# Patient Record
Sex: Male | Born: 1990
Health system: Southern US, Community
[De-identification: ages and names within clinical notes are randomized; demographics above are authoritative.]

---
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
## 2013-05-10 ENCOUNTER — Emergency Department (HOSPITAL_COMMUNITY)
Admission: EM | Admit: 2013-05-10 | Discharge: 2013-05-10 | Disposition: A | Discharge status: Home / Self Care | Payer: PRIVATE HEALTH INSURANCE | Attending: Emergency Medicine | Admitting: Emergency Medicine

## 2013-05-10 ENCOUNTER — Emergency Department (HOSPITAL_COMMUNITY): Payer: PRIVATE HEALTH INSURANCE

## 2013-05-10 ENCOUNTER — Emergency Department (HOSPITAL_COMMUNITY): Admission: EM | Admit: 2013-05-10 | Discharge: 2013-05-10 | Discharge status: Against Medical Advice | Payer: Self-pay

## 2013-05-10 ENCOUNTER — Encounter (HOSPITAL_COMMUNITY): Payer: Self-pay | Admitting: Emergency Medicine

## 2013-05-10 DIAGNOSIS — S20229A Contusion of unspecified back wall of thorax, initial encounter: Secondary | ICD-10-CM | POA: Insufficient documentation

## 2013-05-10 DIAGNOSIS — Y9289 Other specified places as the place of occurrence of the external cause: Secondary | ICD-10-CM | POA: Insufficient documentation

## 2013-05-10 DIAGNOSIS — Y9352 Activity, horseback riding: Secondary | ICD-10-CM | POA: Insufficient documentation

## 2013-05-10 DIAGNOSIS — W19XXXA Unspecified fall, initial encounter: Secondary | ICD-10-CM

## 2013-05-10 DIAGNOSIS — R296 Repeated falls: Secondary | ICD-10-CM | POA: Insufficient documentation

## 2013-05-10 DIAGNOSIS — S300XXA Contusion of lower back and pelvis, initial encounter: Secondary | ICD-10-CM

## 2013-05-10 LAB — URINALYSIS, ROUTINE W REFLEX MICROSCOPIC
Bilirubin Urine: NEGATIVE
Glucose, UA: NEGATIVE mg/dL
Hgb urine dipstick: NEGATIVE
Ketones, ur: NEGATIVE mg/dL
Leukocytes, UA: NEGATIVE
Nitrite: NEGATIVE
Protein, ur: NEGATIVE mg/dL
Specific Gravity, Urine: 1.029 (ref 1.005–1.030)
Urobilinogen, UA: 1 mg/dL (ref 0.0–1.0)
pH: 5.5 (ref 5.0–8.0)

## 2013-05-10 MED ORDER — IBUPROFEN 800 MG PO TABS: 800.0000 mg | ORAL_TABLET | Freq: Three times a day (TID) | ORAL | Status: DC

## 2013-05-10 MED ORDER — HYDROCODONE-ACETAMINOPHEN 5-325 MG PO TABS: 1.0000 | ORAL_TABLET | ORAL | Status: DC | PRN

## 2013-05-10 NOTE — ED Notes (Signed)
He states he was bucked from a horse about two hours p.t.a.  He states his low back area struck a rock. He has an abrasion at right lumbar area.  He is in no distress.  He ambulates slowly and capably.

## 2013-05-10 NOTE — ED Provider Notes (Signed)
History/physical exam/procedure(s) were performed by non-physician practitioner and as supervising physician I was immediately available for consultation/collaboration. I have reviewed all notes and am in agreement with care and plan.   Hilario Quarryanielle S Lieutenant Abarca, MD 05/10/13 [phone]

## 2013-05-10 NOTE — ED Provider Notes (Signed)
CSN: [id number]     Arrival date & time 05/10/13  1811 History   First MD Initiated Contact with Patient 05/10/13 1908     Chief Complaint  Patient presents with  . Fall  . Back Pain     (Consider location/radiation/quality/duration/timing/severity/associated sxs/prior Treatment) Patient is a 23 y.o. male presenting with fall and back pain. The history is provided by the patient. No language interpreter was used.  Fall This is a new problem. The current episode started today. Pertinent negatives include no chills or fever. Associated symptoms comments: He was thrown while horseback riding and reports landing flat on his back to the ground on a rock that injured in lower back. He has been ambulatory since the fall. He has not been incontinent. He denies hitting his head or sustaining other injury..  Back Pain Associated symptoms: no fever     No past medical history on file. No past surgical history on file. No family history on file. History  Substance Use Topics  . Smoking status: Never Smoker   . Smokeless tobacco: Not on file  . Alcohol Use: Not on file    Review of Systems  Constitutional: Negative for fever and chills.  Respiratory: Negative.   Cardiovascular: Negative.   Gastrointestinal: Negative.   Genitourinary: Negative.  Negative for difficulty urinating.  Musculoskeletal: Positive for back pain.       See HPI  Skin: Negative.  Negative for wound.  Neurological: Negative.       Allergies  Review of patient's allergies indicates no known allergies.  Home Medications   Current Outpatient Rx  Name  Route  Sig  Dispense  Refill  . ibuprofen (ADVIL,MOTRIN) 200 MG tablet   Oral   Take 400 mg by mouth every 6 (six) hours as needed for moderate pain.         Marland Kitchen. PRESCRIPTION MEDICATION   Oral   Take 1 tablet by mouth once. Anti-inflammatory, possibly meloxicam         . PRESCRIPTION MEDICATION   Oral   Take 1 tablet by mouth once. For muscle spasms           BP 151/78  Pulse 90  Temp(Src) 98.8 F (37.1 C) (Oral)  Resp 18  SpO2 100% Physical Exam  Constitutional: He is oriented to person, place, and time. He appears well-developed and well-nourished.  Neck: Normal range of motion.  Pulmonary/Chest: Effort normal.  Abdominal: There is no tenderness.  Musculoskeletal: Normal range of motion.  Midline tenderness and swelling over lumbar spine.   Neurological: He is alert and oriented to person, place, and time.  Skin: Skin is warm and dry.  Linear abrasion across right lower back from midline lumbar to right flank. No bleeding or laceration.  Psychiatric: He has a normal mood and affect.    ED Course  Procedures (including critical care time) Labs Review Labs Reviewed  URINALYSIS, ROUTINE W REFLEX MICROSCOPIC   Results for orders placed during the hospital encounter of 05/10/13  URINALYSIS, ROUTINE W REFLEX MICROSCOPIC      Result Value Ref Range   Color, Urine YELLOW  YELLOW   APPearance CLEAR  CLEAR   Specific Gravity, Urine 1.029  1.005 - 1.030   pH 5.5  5.0 - 8.0   Glucose, UA NEGATIVE  NEGATIVE mg/dL   Hgb urine dipstick NEGATIVE  NEGATIVE   Bilirubin Urine NEGATIVE  NEGATIVE   Ketones, ur NEGATIVE  NEGATIVE mg/dL   Protein, ur NEGATIVE  NEGATIVE mg/dL  Urobilinogen, UA 1.0  0.0 - 1.0 mg/dL   Nitrite NEGATIVE  NEGATIVE   Leukocytes, UA NEGATIVE  NEGATIVE   No results found.  Imaging Review No results found.   EKG Interpretation None      MDM   Final diagnoses:  None    1. Fall 2. Contusion back  Discussed lumbar x-ray results with Dr. Mosetta Putt (radiology) who reports lumbar film as negative, but is not accessible for viewing from [facility]. UA negative for hematuria. He declines pain medication in the ED. VSS. Ambulatory without difficulty. Stable for discharge.     Arnoldo Hooker, PA-C 05/10/13 2127

## 2013-05-10 NOTE — Discharge Instructions (Signed)
Contusion °A contusion is a deep bruise. Contusions are the result of an injury that caused bleeding under the skin. The contusion may turn blue, purple, or yellow. Minor injuries will give you a painless contusion, but more severe contusions may stay painful and swollen for a few weeks.  °CAUSES  °A contusion is usually caused by a blow, trauma, or direct force to an area of the body. °SYMPTOMS  °· Swelling and redness of the injured area. °· Bruising of the injured area. °· Tenderness and soreness of the injured area. °· Pain. °DIAGNOSIS  °The diagnosis can be made by taking a history and physical exam. An X-ray, CT scan, or MRI may be needed to determine if there were any associated injuries, such as fractures. °TREATMENT  °Specific treatment will depend on what area of the body was injured. In general, the best treatment for a contusion is resting, icing, elevating, and applying cold compresses to the injured area. Over-the-counter medicines may also be recommended for pain control. Ask your caregiver what the best treatment is for your contusion. °HOME CARE INSTRUCTIONS  °· Put ice on the injured area. °· Put ice in a plastic bag. °· Place a towel between your skin and the bag. °· Leave the ice on for 15-20 minutes, 03-04 times a day. °· Only take over-the-counter or prescription medicines for pain, discomfort, or fever as directed by your caregiver. Your caregiver may recommend avoiding anti-inflammatory medicines (aspirin, ibuprofen, and naproxen) for 48 hours because these medicines may increase bruising. °· Rest the injured area. °· If possible, elevate the injured area to reduce swelling. °SEEK IMMEDIATE MEDICAL CARE IF:  °· You have increased bruising or swelling. °· You have pain that is getting worse. °· Your swelling or pain is not relieved with medicines. °MAKE SURE YOU:  °· Understand these instructions. °· Will watch your condition. °· Will get help right away if you are not doing well or get  worse. °Document Released: 11/15/2004 Document Revised: 04/30/2011 Document Reviewed: 12/11/2010 °ExitCare® Patient Information ©2014 [employer]. ° °Cryotherapy °Cryotherapy means treatment with cold. Ice or gel packs can be used to reduce both pain and swelling. Ice is the most helpful within the first 24 to 48 hours after an injury or flareup from overusing a muscle or joint. Sprains, strains, spasms, burning pain, shooting pain, and aches can all be eased with ice. Ice can also be used when recovering from surgery. Ice is effective, has very few side effects, and is safe for most people to use. °PRECAUTIONS  °Ice is not a safe treatment option for people with: °· Raynaud's phenomenon. This is a condition affecting small blood vessels in the extremities. Exposure to cold may cause your problems to return. °· Cold hypersensitivity. There are many forms of cold hypersensitivity, including: °· Cold urticaria. Red, itchy hives appear on the skin when the tissues begin to warm after being iced. °· Cold erythema. This is a red, itchy rash caused by exposure to cold. °· Cold hemoglobinuria. Red blood cells break down when the tissues begin to warm after being iced. The hemoglobin that carry oxygen are passed into the urine because they cannot combine with blood proteins fast enough. °· Numbness or altered sensitivity in the area being iced. °If you have any of the following conditions, do not use ice until you have discussed cryotherapy with your caregiver: °· Heart conditions, such as arrhythmia, angina, or chronic heart disease. °· High blood pressure. °· Healing wounds or open skin   in the area being iced. °· Current infections. °· Rheumatoid arthritis. °· Poor circulation. °· Diabetes. °Ice slows the blood flow in the region it is applied. This is beneficial when trying to stop inflamed tissues from spreading irritating chemicals to surrounding tissues. However, if you expose your skin to cold temperatures for too  long or without the proper protection, you can damage your skin or nerves. Watch for signs of skin damage due to cold. °HOME CARE INSTRUCTIONS °Follow these tips to use ice and cold packs safely. °· Place a dry or damp towel between the ice and skin. A damp towel will cool the skin more quickly, so you may need to shorten the time that the ice is used. °· For a more rapid response, add gentle compression to the ice. °· Ice for no more than 10 to 20 minutes at a time. The bonier the area you are icing, the less time it will take to get the benefits of ice. °· Check your skin after 5 minutes to make sure there are no signs of a poor response to cold or skin damage. °· Rest 20 minutes or more in between uses. °· Once your skin is numb, you can end your treatment. You can test numbness by very lightly touching your skin. The touch should be so light that you do not see the skin dimple from the pressure of your fingertip. When using ice, most people will feel these normal sensations in this order: cold, burning, aching, and numbness. °· Do not use ice on someone who cannot communicate their responses to pain, such as small children or people with dementia. °HOW TO MAKE AN ICE PACK °Ice packs are the most common way to use ice therapy. Other methods include ice massage, ice baths, and cryo-sprays. Muscle creams that cause a cold, tingly feeling do not offer the same benefits that ice offers and should not be used as a substitute unless recommended by your caregiver. °To make an ice pack, do one of the following: °· Place crushed ice or a bag of frozen vegetables in a sealable plastic bag. Squeeze out the excess air. Place this bag inside another plastic bag. Slide the bag into a pillowcase or place a damp towel between your skin and the bag. °· Mix 3 parts water with 1 part rubbing alcohol. Freeze the mixture in a sealable plastic bag. When you remove the mixture from the freezer, it will be slushy. Squeeze out the excess  air. Place this bag inside another plastic bag. Slide the bag into a pillowcase or place a damp towel between your skin and the bag. °SEEK MEDICAL CARE IF: °· You develop white spots on your skin. This may give the skin a blotchy (mottled) appearance. °· Your skin turns blue or pale. °· Your skin becomes waxy or hard. °· Your swelling gets worse. °MAKE SURE YOU:  °· Understand these instructions. °· Will watch your condition. °· Will get help right away if you are not doing well or get worse. °Document Released: 10/02/2010 Document Revised: 04/30/2011 Document Reviewed: 10/02/2010 °ExitCare® Patient Information ©2014 [employer]. ° °

## 2013-05-10 NOTE — ED Notes (Signed)
Bed: WA24 Expected date:  Expected time:  Means of arrival:  Comments: triage 

## 2016-02-15 ENCOUNTER — Emergency Department (HOSPITAL_COMMUNITY): Payer: No Typology Code available for payment source | Admitting: Certified Registered"

## 2016-02-15 ENCOUNTER — Encounter (HOSPITAL_COMMUNITY): Payer: Self-pay | Admitting: Emergency Medicine

## 2016-02-15 ENCOUNTER — Ambulatory Visit (HOSPITAL_COMMUNITY)
Admission: EM | Admit: 2016-02-15 | Discharge: 2016-02-16 | Disposition: A | Discharge status: Home / Self Care | Payer: No Typology Code available for payment source | Attending: General Surgery | Admitting: General Surgery

## 2016-02-15 ENCOUNTER — Emergency Department (HOSPITAL_COMMUNITY): Payer: No Typology Code available for payment source

## 2016-02-15 ENCOUNTER — Encounter (HOSPITAL_COMMUNITY)
Admission: EM | Disposition: A | Discharge status: Home / Self Care | Payer: Self-pay | Source: Home / Self Care | Attending: Emergency Medicine

## 2016-02-15 DIAGNOSIS — K353 Acute appendicitis with localized peritonitis, without perforation or gangrene: Secondary | ICD-10-CM

## 2016-02-15 DIAGNOSIS — K3580 Unspecified acute appendicitis: Secondary | ICD-10-CM | POA: Diagnosis present

## 2016-02-15 HISTORY — PX: LAPAROSCOPIC APPENDECTOMY: SHX408

## 2016-02-15 LAB — URINALYSIS, ROUTINE W REFLEX MICROSCOPIC
Bilirubin Urine: NEGATIVE
Glucose, UA: NEGATIVE mg/dL
Hgb urine dipstick: NEGATIVE
Ketones, ur: NEGATIVE mg/dL
Nitrite: NEGATIVE
Protein, ur: NEGATIVE mg/dL
Specific Gravity, Urine: 1.012 (ref 1.005–1.030)
pH: 6 (ref 5.0–8.0)

## 2016-02-15 LAB — COMPREHENSIVE METABOLIC PANEL
ALT: 88 U/L — ABNORMAL HIGH (ref 17–63)
AST: 80 U/L — ABNORMAL HIGH (ref 15–41)
Albumin: 4.3 g/dL (ref 3.5–5.0)
Alkaline Phosphatase: 91 U/L (ref 38–126)
Anion gap: 10 (ref 5–15)
BUN: 9 mg/dL (ref 6–20)
CO2: 27 mmol/L (ref 22–32)
Calcium: 9.9 mg/dL (ref 8.9–10.3)
Chloride: 103 mmol/L (ref 101–111)
Creatinine, Ser: 1.11 mg/dL (ref 0.61–1.24)
GFR calc Af Amer: >60 mL/min (ref >60)
GFR calc non Af Amer: >60 mL/min (ref >60)
Glucose, Bld: 128 mg/dL — ABNORMAL HIGH (ref 65–99)
Potassium: 3.9 mmol/L (ref 3.5–5.1)
Sodium: 140 mmol/L (ref 135–145)
Total Bilirubin: 0.7 mg/dL (ref 0.3–1.2)
Total Protein: 7.8 g/dL (ref 6.5–8.1)

## 2016-02-15 LAB — CBC
HCT: 43.3 % (ref 39.0–52.0)
Hemoglobin: 14.4 g/dL (ref 13.0–17.0)
MCH: 28.2 pg (ref 26.0–34.0)
MCHC: 33.3 g/dL (ref 30.0–36.0)
MCV: 84.9 fL (ref 78.0–100.0)
Platelets: 304 10*3/uL (ref 150–400)
RBC: 5.1 MIL/uL (ref 4.22–5.81)
RDW: 13.3 % (ref 11.5–15.5)
WBC: 12.9 10*3/uL — ABNORMAL HIGH (ref 4.0–10.5)

## 2016-02-15 LAB — LIPASE, BLOOD: Lipase: 27 U/L (ref 11–51)

## 2016-02-15 SURGERY — APPENDECTOMY, LAPAROSCOPIC
Anesthesia: General | Site: Abdomen

## 2016-02-15 MED ORDER — SUFENTANIL CITRATE 50 MCG/ML IV SOLN
INTRAVENOUS | Status: AC
Start: 2016-02-15 — End: 2016-02-15
  Filled 2016-02-15: qty 1

## 2016-02-15 MED ORDER — SODIUM CHLORIDE 0.9 % IR SOLN
Status: DC | PRN
  Administered 2016-02-15: 1000 mL

## 2016-02-15 MED ORDER — LIDOCAINE HCL (CARDIAC) 20 MG/ML IV SOLN
INTRAVENOUS | Status: DC | PRN
  Administered 2016-02-15: 100 mg via INTRATRACHEAL

## 2016-02-15 MED ORDER — BUPIVACAINE HCL (PF) 0.25 % IJ SOLN
INTRAMUSCULAR | Status: AC
  Filled 2016-02-15: qty 30

## 2016-02-15 MED ORDER — IOPAMIDOL (ISOVUE-300) INJECTION 61%
INTRAVENOUS | Status: AC
  Administered 2016-02-15: 100 mL
  Filled 2016-02-15: qty 100

## 2016-02-15 MED ORDER — SUFENTANIL CITRATE 50 MCG/ML IV SOLN
INTRAVENOUS | Status: DC | PRN
  Administered 2016-02-15: 10 ug via INTRAVENOUS
  Administered 2016-02-15: 15 ug via INTRAVENOUS

## 2016-02-15 MED ORDER — DEXTROSE 5 % IV SOLN
2.0000 g | Freq: Once | INTRAVENOUS | Status: AC
  Administered 2016-02-15: 2 g via INTRAVENOUS
  Filled 2016-02-15: qty 2

## 2016-02-15 MED ORDER — 0.9 % SODIUM CHLORIDE (POUR BTL) OPTIME
TOPICAL | Status: DC | PRN
  Administered 2016-02-15: 1000 mL

## 2016-02-15 MED ORDER — LACTATED RINGERS IV SOLN
INTRAVENOUS | Status: DC | PRN
  Administered 2016-02-15 – 2016-02-16 (×2): via INTRAVENOUS

## 2016-02-15 MED ORDER — METRONIDAZOLE IN NACL 5-0.79 MG/ML-% IV SOLN
500.0000 mg | Freq: Once | INTRAVENOUS | Status: AC
  Administered 2016-02-15: 500 mg via INTRAVENOUS
  Filled 2016-02-15: qty 100

## 2016-02-15 MED ORDER — PROPOFOL 10 MG/ML IV BOLUS
INTRAVENOUS | Status: DC | PRN
  Administered 2016-02-15: 30 mg via INTRAVENOUS
  Administered 2016-02-15: 170 mg via INTRAVENOUS

## 2016-02-15 MED ORDER — MIDAZOLAM HCL 2 MG/2ML IJ SOLN
INTRAMUSCULAR | Status: AC
  Filled 2016-02-15: qty 2

## 2016-02-15 MED ORDER — PROPOFOL 10 MG/ML IV BOLUS
INTRAVENOUS | Status: AC
  Filled 2016-02-15: qty 20

## 2016-02-15 MED ORDER — SODIUM CHLORIDE 0.9 % IV BOLUS (SEPSIS)
1000.0000 mL | Freq: Once | INTRAVENOUS | Status: AC
  Administered 2016-02-15: 1000 mL via INTRAVENOUS

## 2016-02-15 MED ORDER — DEXAMETHASONE SODIUM PHOSPHATE 10 MG/ML IJ SOLN
INTRAMUSCULAR | Status: DC | PRN
  Administered 2016-02-15: 10 mg via INTRAVENOUS

## 2016-02-15 MED ORDER — ONDANSETRON HCL 4 MG/2ML IJ SOLN: 4.0000 mg | Freq: Once | INTRAMUSCULAR | Status: DC

## 2016-02-15 MED ORDER — ROCURONIUM BROMIDE 100 MG/10ML IV SOLN
INTRAVENOUS | Status: DC | PRN
  Administered 2016-02-15: 50 mg via INTRAVENOUS

## 2016-02-15 MED ORDER — SUCCINYLCHOLINE CHLORIDE 20 MG/ML IJ SOLN
INTRAMUSCULAR | Status: DC | PRN
  Administered 2016-02-15: 140 mg via INTRAVENOUS

## 2016-02-15 MED ORDER — MIDAZOLAM HCL 5 MG/5ML IJ SOLN
INTRAMUSCULAR | Status: DC | PRN
  Administered 2016-02-15: 2 mg via INTRAVENOUS

## 2016-02-15 SURGICAL SUPPLY — 47 items
ADH SKN CLS APL DERMABOND .7 (GAUZE/BANDAGES/DRESSINGS) ×1
APPLIER CLIP ROT 10 11.4 M/L (STAPLE)
APR CLP MED LRG 11.4X10 (STAPLE)
BAG SPEC RTRVL LRG 6X4 10 (ENDOMECHANICALS) ×1
BLADE SURG ROTATE 9660 (MISCELLANEOUS) ×2 IMPLANT
CANISTER SUCTION 2500CC (MISCELLANEOUS) ×3 IMPLANT
CHLORAPREP W/TINT 26ML (MISCELLANEOUS) ×3 IMPLANT
CLIP APPLIE ROT 10 11.4 M/L (STAPLE) IMPLANT
CLOSURE WOUND 1/2 X4 (GAUZE/BANDAGES/DRESSINGS) ×1
COVER SURGICAL LIGHT HANDLE (MISCELLANEOUS) ×3 IMPLANT
CUTTER FLEX LINEAR 45M (STAPLE) ×3 IMPLANT
DERMABOND ADVANCED (GAUZE/BANDAGES/DRESSINGS) ×2
DERMABOND ADVANCED .7 DNX12 (GAUZE/BANDAGES/DRESSINGS) ×1 IMPLANT
DRSG TEGADERM 2-3/8X2-3/4 SM (GAUZE/BANDAGES/DRESSINGS) ×5 IMPLANT
ELECT REM PT RETURN 9FT ADLT (ELECTROSURGICAL) ×3
ELECTRODE REM PT RTRN 9FT ADLT (ELECTROSURGICAL) ×1 IMPLANT
ENDOLOOP SUT PDS II  0 18 (SUTURE)
ENDOLOOP SUT PDS II 0 18 (SUTURE) IMPLANT
GLOVE BIOGEL PI IND STRL 8 (GLOVE) ×1 IMPLANT
GLOVE BIOGEL PI INDICATOR 8 (GLOVE) ×2
GLOVE ECLIPSE 7.5 STRL STRAW (GLOVE) ×3 IMPLANT
GOWN STRL REUS W/ TWL LRG LVL3 (GOWN DISPOSABLE) ×3 IMPLANT
GOWN STRL REUS W/TWL LRG LVL3 (GOWN DISPOSABLE) ×9
KIT BASIN OR (CUSTOM PROCEDURE TRAY) ×3 IMPLANT
KIT ROOM TURNOVER OR (KITS) ×3 IMPLANT
NS IRRIG 1000ML POUR BTL (IV SOLUTION) ×3 IMPLANT
PAD ARMBOARD 7.5X6 YLW CONV (MISCELLANEOUS) ×6 IMPLANT
POUCH SPECIMEN RETRIEVAL 10MM (ENDOMECHANICALS) ×3 IMPLANT
RELOAD 45 VASCULAR/THIN (ENDOMECHANICALS) IMPLANT
RELOAD STAPLE 45 2.5 WHT GRN (ENDOMECHANICALS) IMPLANT
RELOAD STAPLE 45 3.5 BLU ETS (ENDOMECHANICALS) IMPLANT
RELOAD STAPLE TA45 3.5 REG BLU (ENDOMECHANICALS) ×3 IMPLANT
SCALPEL HARMONIC ACE (MISCELLANEOUS) ×3 IMPLANT
SCISSORS LAP 5X35 DISP (ENDOMECHANICALS) ×2 IMPLANT
SET IRRIG TUBING LAPAROSCOPIC (IRRIGATION / IRRIGATOR) ×3 IMPLANT
SHEARS HARMONIC ACE PLUS 36CM (ENDOMECHANICALS) ×2 IMPLANT
SLEEVE ENDOPATH XCEL 5M (ENDOMECHANICALS) ×3 IMPLANT
SPECIMEN JAR SMALL (MISCELLANEOUS) ×3 IMPLANT
STRIP CLOSURE SKIN 1/2X4 (GAUZE/BANDAGES/DRESSINGS) ×2 IMPLANT
SUT MNCRL AB 4-0 PS2 18 (SUTURE) ×3 IMPLANT
TOWEL OR 17X24 6PK STRL BLUE (TOWEL DISPOSABLE) ×3 IMPLANT
TOWEL OR 17X26 10 PK STRL BLUE (TOWEL DISPOSABLE) ×3 IMPLANT
TRAY FOLEY CATH 16FR SILVER (SET/KITS/TRAYS/PACK) ×3 IMPLANT
TRAY LAPAROSCOPIC MC (CUSTOM PROCEDURE TRAY) ×3 IMPLANT
TROCAR XCEL BLUNT TIP 100MML (ENDOMECHANICALS) ×3 IMPLANT
TROCAR XCEL NON-BLD 5MMX100MML (ENDOMECHANICALS) ×3 IMPLANT
TUBING INSUFFLATION (TUBING) ×3 IMPLANT

## 2016-02-15 NOTE — ED Provider Notes (Signed)
[facility]-EMERGENCY DEPT Provider Note   CSN: [id number] Arrival date & time: 02/15/16  1806     History   Chief Complaint Chief Complaint  Patient presents with  . Abdominal Pain    Possible Appendicitis    HPI Jorge Warren is a 25 y.o. male.  HPI   Jorge Warren is a 25 y.o. male, patient with no pertinent past medical history, presenting to the ED with right lower quadrant pain beginning last night. Accompanied by nausea and vomiting. Pain is stabbing, rated 5 out of 10, radiates into the right upper quadrant. Pain is exacerbated by movement and walking. Patient [place] in [place] and advised to go to the ED. Denies fever/chills, diarrhea, or any other complaints.  Last food intake was around noon today.    History reviewed. No pertinent past medical history.  There are no active problems to display for this patient.   History reviewed. No pertinent surgical history.     Home Medications    Prior to Admission medications   Medication Sig Start Date End Date Taking? Authorizing Provider  ibuprofen (ADVIL,MOTRIN) 800 MG tablet Take 1 tablet (800 mg total) by mouth 3 (three) times daily. Patient not taking: Reported on 02/15/2016 05/10/13   Elpidio AnisShari Upstill, PA-C    Family History No family history on file.  Social History Social History  Substance Use Topics  . Smoking status: Never Smoker  . Smokeless tobacco: Never Used  . Alcohol use No     Allergies   Patient has no known allergies.   Review of Systems Review of Systems  Constitutional: Negative for chills and fever.  Gastrointestinal: Positive for abdominal pain, nausea and vomiting. Negative for diarrhea.  All other systems reviewed and are negative.    Physical Exam Updated Vital Signs BP 148/76 (BP Location: Left Arm)   Pulse 87   Temp 98.6 F (37 C) (Oral)   Resp 16   Ht 5\' 11"  (1.803 m)   Wt 93 kg   SpO2 98%   BMI 28.59 kg/m   Physical Exam    Constitutional: He appears well-developed and well-nourished. No distress.  HENT:  Head: Normocephalic and atraumatic.  Eyes: Conjunctivae are normal.  Neck: Neck supple.  Cardiovascular: Normal rate, regular rhythm, normal heart sounds and intact distal pulses.   Pulmonary/Chest: Effort normal and breath sounds normal. No respiratory distress.  Abdominal: Soft. There is tenderness in the right lower quadrant. There is tenderness at McBurney's point. There is no guarding.  Musculoskeletal: He exhibits no edema.  Lymphadenopathy:    He has no cervical adenopathy.  Neurological: He is alert.  Skin: Skin is warm and dry. He is not diaphoretic.  Psychiatric: He has a normal mood and affect. His behavior is normal.  Nursing note and vitals reviewed.    ED Treatments / Results  Labs (all labs ordered are listed, but only abnormal results are displayed) Labs Reviewed  COMPREHENSIVE METABOLIC PANEL - Abnormal; Notable for the following:       Result Value   Glucose, Bld 128 (*)    AST 80 (*)    ALT 88 (*)    All other components within normal limits  CBC - Abnormal; Notable for the following:    WBC 12.9 (*)    All other components within normal limits  URINALYSIS, ROUTINE W REFLEX MICROSCOPIC - Abnormal; Notable for the following:    Leukocytes, UA TRACE (*)    Bacteria, UA RARE (*)  Squamous Epithelial / LPF 0-5 (*)    All other components within normal limits  LIPASE, BLOOD    EKG  EKG Interpretation None       Radiology Ct Abdomen Pelvis W Contrast  Result Date: 02/15/2016 CLINICAL DATA:  Right lower quadrant abdominal pain, onset yesterday. Nausea. EXAM: CT ABDOMEN AND PELVIS WITH CONTRAST TECHNIQUE: Multidetector CT imaging of the abdomen and pelvis was performed using the standard protocol following bolus administration of intravenous contrast. CONTRAST:  ISOVUE-300 IOPAMIDOL (ISOVUE-300) INJECTION 61% COMPARISON:  None. FINDINGS: Lower chest: The lung bases  are clear. Hepatobiliary: No focal liver abnormality is seen. No gallstones, gallbladder wall thickening, or biliary dilatation. Pancreas: Unremarkable. No pancreatic ductal dilatation or surrounding inflammatory changes. Spleen: Normal in size without focal abnormality. Adrenals/Urinary Tract: Adrenal glands are unremarkable. Kidneys are normal, without renal calculi, focal lesion, or hydronephrosis. Bladder is unremarkable. Stomach/Bowel: The appendix is dilated measuring 10 mm, with wall thickening and intraluminal fluid. There is periappendiceal soft tissue stranding and small amount of free fluid. No perforation or abscess. Stomach is decompressed. Fluid within normal caliber small bowel loops in the pelvis, likely reactive. No colonic inflammation. No bowel obstruction. Vascular/Lymphatic: Small right lower quadrant and ileocolic nodes are likely reactive. No retroperitoneal or pelvic adenopathy. No acute vascular finding. Reproductive: Prostate is unremarkable. Other: Trace free fluid in the pelvis is likely reactive. New free air or intra- abdominal abscess. Musculoskeletal: There are no acute or suspicious osseous abnormalities. IMPRESSION: Uncomplicated acute appendicitis. Electronically Signed   By: Rubye Oaks M.D.   On: 02/15/2016 21:19    Procedures Procedures (including critical care time)  Medications Ordered in ED Medications  cefTRIAXone (ROCEPHIN) 2 g in dextrose 5 % 50 mL IVPB (2 g Intravenous New Bag/Given 02/15/16 2234)    And  metroNIDAZOLE (FLAGYL) IVPB 500 mg (500 mg Intravenous New Bag/Given 02/15/16 2234)  ondansetron (ZOFRAN) injection 4 mg (not administered)  iopamidol (ISOVUE-300) 61 % injection (100 mLs  Contrast Given 02/15/16 2030)  sodium chloride 0.9 % bolus 1,000 mL (1,000 mLs Intravenous New Bag/Given 02/15/16 2234)     Initial Impression / Assessment and Plan / ED Course  I have reviewed the triage vital signs and the nursing notes.  Pertinent labs &  imaging results that were available during my care of the patient were reviewed by me and considered in my medical decision making (see chart for details).  Clinical Course     Patient presents with right lower quadrant abdominal pain in a classic appendicitis presentation. Appendicitis on abdominal CT. IV fluids and appropriate antibiotics started. Patient NPO. Pt was offered pain medication, but declined.  10:25 PM Spoke with Dr. Lindie Spruce, general surgeon, who agreed to come see the patient. Dr. Lindie Spruce evaluated the patient and will be taking him to the OR.  Findings and plan of care discussed with Tilden Fossa, MD.   Vitals:   02/15/16 1913 02/15/16 2059 02/15/16 2211  BP: 142/88 148/76 130/79  Pulse: 69 87 62  Resp: 16 16 14   Temp: 98.6 F (37 C)  98.3 F (36.8 C)  TempSrc: Oral  Oral  SpO2: 99% 98% 100%  Weight: 93 kg    Height: 5\' 11"  (1.803 m)      Final Clinical Impressions(s) / ED Diagnoses   Final diagnoses:  Acute appendicitis with localized peritonitis    New Prescriptions New Prescriptions   No medications on file     Anselm Pancoast, PA-C 02/15/16 2300    Jaymee Tilson  Adonis HousekeeperC Swathi Dauphin, PA-C 02/15/16 2300    Tilden FossaElizabeth Rees, MD 02/17/16 [phone]

## 2016-02-15 NOTE — H&P (Signed)
Jorge Warren is an 25 y.o. male.   Chief Complaint: Abdominal pain HPI: Had symptoms last week which resolved without specific treatment.  Thought the same would happen today at 5:00 pm which current symptoms started, but it did not get better--just got worse.  Came to the ED with elevated WBC and CT demonstrating likely acute appendicitis.  History reviewed. No pertinent past medical history.  History reviewed. No pertinent surgical history.  No family history on file. Social History:  reports that he has never smoked. He has never used smokeless tobacco. He reports that he does not drink alcohol or use drugs.  Allergies: No Known Allergies   (Not in a hospital admission)  Results for orders placed or performed during the hospital encounter of 02/15/16 (from the past 48 hour(s))  Urinalysis, Routine w reflex microscopic     Status: Abnormal   Collection Time: 02/15/16  7:15 PM  Result Value Ref Range   Color, Urine YELLOW YELLOW   APPearance CLEAR CLEAR   Specific Gravity, Urine 1.012 1.005 - 1.030   pH 6.0 5.0 - 8.0   Glucose, UA NEGATIVE NEGATIVE mg/dL   Hgb urine dipstick NEGATIVE NEGATIVE   Bilirubin Urine NEGATIVE NEGATIVE   Ketones, ur NEGATIVE NEGATIVE mg/dL   Protein, ur NEGATIVE NEGATIVE mg/dL   Nitrite NEGATIVE NEGATIVE   Leukocytes, UA TRACE (A) NEGATIVE   RBC / HPF 0-5 0 - 5 RBC/hpf   WBC, UA 0-5 0 - 5 WBC/hpf   Bacteria, UA RARE (A) NONE SEEN   Squamous Epithelial / LPF 0-5 (A) NONE SEEN   Mucous PRESENT   Lipase, blood     Status: None   Collection Time: 02/15/16  7:18 PM  Result Value Ref Range   Lipase 27 11 - 51 U/L  Comprehensive metabolic panel     Status: Abnormal   Collection Time: 02/15/16  7:18 PM  Result Value Ref Range   Sodium 140 135 - 145 mmol/L   Potassium 3.9 3.5 - 5.1 mmol/L   Chloride 103 101 - 111 mmol/L   CO2 27 22 - 32 mmol/L   Glucose, Bld 128 (H) 65 - 99 mg/dL   BUN 9 6 - 20 mg/dL   Creatinine, Ser 1.11 0.61 - 1.24 mg/dL   Calcium 9.9 8.9 - 10.3 mg/dL   Total Protein 7.8 6.5 - 8.1 g/dL   Albumin 4.3 3.5 - 5.0 g/dL   AST 80 (H) 15 - 41 U/L   ALT 88 (H) 17 - 63 U/L   Alkaline Phosphatase 91 38 - 126 U/L   Total Bilirubin 0.7 0.3 - 1.2 mg/dL   GFR calc non Af Amer >60 >60 mL/min   GFR calc Af Amer >60 >60 mL/min    Comment: (NOTE) The eGFR has been calculated using the CKD EPI equation. This calculation has not been validated in all clinical situations. eGFR's persistently <60 mL/min signify possible Chronic Kidney Disease.    Anion gap 10 5 - 15  CBC     Status: Abnormal   Collection Time: 02/15/16  7:18 PM  Result Value Ref Range   WBC 12.9 (H) 4.0 - 10.5 K/uL   RBC 5.10 4.22 - 5.81 MIL/uL   Hemoglobin 14.4 13.0 - 17.0 g/dL   HCT 43.3 39.0 - 52.0 %   MCV 84.9 78.0 - 100.0 fL   MCH 28.2 26.0 - 34.0 pg   MCHC 33.3 30.0 - 36.0 g/dL   RDW 13.3 11.5 - 15.5 %  Platelets 304 150 - 400 K/uL   Ct Abdomen Pelvis W Contrast  Result Date: 02/15/2016 CLINICAL DATA:  Right lower quadrant abdominal pain, onset yesterday. Nausea. EXAM: CT ABDOMEN AND PELVIS WITH CONTRAST TECHNIQUE: Multidetector CT imaging of the abdomen and pelvis was performed using the standard protocol following bolus administration of intravenous contrast. CONTRAST:  170m ISOVUE-300 IOPAMIDOL (ISOVUE-300) INJECTION 61% COMPARISON:  None. FINDINGS: Lower chest: The lung bases are clear. Hepatobiliary: No focal liver abnormality is seen. No gallstones, gallbladder wall thickening, or biliary dilatation. Pancreas: Unremarkable. No pancreatic ductal dilatation or surrounding inflammatory changes. Spleen: Normal in size without focal abnormality. Adrenals/Urinary Tract: Adrenal glands are unremarkable. Kidneys are normal, without renal calculi, focal lesion, or hydronephrosis. Bladder is unremarkable. Stomach/Bowel: The appendix is dilated measuring 10 mm, with wall thickening and intraluminal fluid. There is periappendiceal soft tissue stranding and  small amount of free fluid. No perforation or abscess. Stomach is decompressed. Fluid within normal caliber small bowel loops in the pelvis, likely reactive. No colonic inflammation. No bowel obstruction. Vascular/Lymphatic: Small right lower quadrant and ileocolic nodes are likely reactive. No retroperitoneal or pelvic adenopathy. No acute vascular finding. Reproductive: Prostate is unremarkable. Other: Trace free fluid in the pelvis is likely reactive. New free air or intra- abdominal abscess. Musculoskeletal: There are no acute or suspicious osseous abnormalities. IMPRESSION: Uncomplicated acute appendicitis. Electronically Signed   By: MJeb LeveringM.D.   On: 02/15/2016 21:19    Review of Systems  Constitutional: Negative for chills and fever.  Gastrointestinal: Positive for abdominal pain, nausea and vomiting.  All other systems reviewed and are negative.   Blood pressure 130/79, pulse 62, temperature 98.3 F (36.8 C), temperature source Oral, resp. rate 14, height 5' 11"  (1.803 m), weight 93 kg (205 lb), SpO2 100 %. Physical Exam  Constitutional: He is oriented to person, place, and time. He appears well-developed and well-nourished.  HENT:  Head: Normocephalic and atraumatic.  Eyes: EOM are normal. Pupils are equal, round, and reactive to light. No scleral icterus.  Neck: Normal range of motion. Neck supple.  Cardiovascular: Normal rate, regular rhythm and normal heart sounds.   Respiratory: Effort normal and breath sounds normal.  GI: Soft. Normal appearance. Bowel sounds are decreased. There is tenderness in the right lower quadrant. There is guarding and tenderness at McBurney's point. There is no rigidity.  Positive Rovsing's sign  Musculoskeletal: Normal range of motion.  Neurological: He is alert and oriented to person, place, and time. He has normal reflexes.  Skin: Skin is warm and dry.  Psychiatric: He has a normal mood and affect. His behavior is normal. Judgment and  thought content normal.     Assessment/Plan Acute appendicitisd clinically and radiologically. IV antibiotics then to the OR tonight. Procedure explained to the patient and significant other  To go to the OR ASAP  JJudeth Horn MD 02/15/2016, 10:51 PM

## 2016-02-15 NOTE — ED Triage Notes (Signed)
Pt. reports RLQ pain with emesis onset yesterday , [facility] advised to go to ER for further evaluation / rule out appendicitis , denies fever or chills .

## 2016-02-15 NOTE — ED Notes (Signed)
Surgeon at bedside.  

## 2016-02-15 NOTE — ED Notes (Signed)
Patient transported to CT 

## 2016-02-15 NOTE — Anesthesia Procedure Notes (Signed)
Procedure Name: Intubation Date/Time: 02/15/2016 11:27 PM Performed by: Melina SchoolsBANKS, Eugune Sine J Pre-anesthesia Checklist: Patient identified, Emergency Drugs available, Suction available, Patient being monitored and Timeout performed Patient Re-evaluated:Patient Re-evaluated prior to inductionOxygen Delivery Method: Circle system utilized Preoxygenation: Pre-oxygenation with 100% oxygen Intubation Type: IV induction, Rapid sequence and Cricoid Pressure applied Laryngoscope Size: Mac and 3 Grade View: Grade I Tube size: 8.0 mm Number of attempts: 1 Airway Equipment and Method: Stylet Placement Confirmation: ETT inserted through vocal cords under direct vision,  positive ETCO2 and breath sounds checked- equal and bilateral Secured at: 23 cm Tube secured with: Tape Dental Injury: Teeth and Oropharynx as per pre-operative assessment

## 2016-02-15 NOTE — Anesthesia Preprocedure Evaluation (Signed)
Anesthesia Evaluation  Patient identified by MRN, date of birth, ID band Patient awake    Reviewed: Allergy & Precautions, NPO status , Patient's Chart, lab work & pertinent test results  Airway Mallampati: II  TM Distance: >3 FB Neck ROM: Full    Dental  (+) Dental Advisory Given   Pulmonary neg pulmonary ROS,    breath sounds clear to auscultation       Cardiovascular negative cardio ROS   Rhythm:Regular Rate:Normal     Neuro/Psych negative neurological ROS     GI/Hepatic Neg liver ROS, Acute appendicitis   Endo/Other  negative endocrine ROS  Renal/GU negative Renal ROS     Musculoskeletal   Abdominal   Peds  Hematology negative hematology ROS (+)   Anesthesia Other Findings   Reproductive/Obstetrics                             Lab Results  Component Value Date   WBC 12.9 (H) 02/15/2016   HGB 14.4 02/15/2016   HCT 43.3 02/15/2016   MCV 84.9 02/15/2016   PLT 304 02/15/2016   Lab Results  Component Value Date   CREATININE 1.11 02/15/2016   BUN 9 02/15/2016   NA 140 02/15/2016   K 3.9 02/15/2016   CL 103 02/15/2016   CO2 27 02/15/2016    Anesthesia Physical Anesthesia Plan  ASA: II and emergent  Anesthesia Plan: General   Post-op Pain Management:    Induction: Intravenous and Rapid sequence  Airway Management Planned: Oral ETT  Additional Equipment:   Intra-op Plan:   Post-operative Plan: Extubation in OR  Informed Consent: I have reviewed the patients History and Physical, chart, labs and discussed the procedure including the risks, benefits and alternatives for the proposed anesthesia with the patient or authorized representative who has indicated his/her understanding and acceptance.   Dental advisory given  Plan Discussed with: CRNA  Anesthesia Plan Comments:         Anesthesia Quick Evaluation

## 2016-02-16 ENCOUNTER — Encounter: Payer: Self-pay | Admitting: General Surgery

## 2016-02-16 ENCOUNTER — Encounter (HOSPITAL_COMMUNITY): Payer: Self-pay | Admitting: General Surgery

## 2016-02-16 DIAGNOSIS — K3580 Unspecified acute appendicitis: Secondary | ICD-10-CM | POA: Diagnosis present

## 2016-02-16 MED ORDER — IBUPROFEN 600 MG PO TABS: 600.0000 mg | ORAL_TABLET | Freq: Four times a day (QID) | ORAL | Status: DC | PRN

## 2016-02-16 MED ORDER — SUCCINYLCHOLINE CHLORIDE 200 MG/10ML IV SOSY
PREFILLED_SYRINGE | INTRAVENOUS | Status: AC
  Filled 2016-02-16: qty 10

## 2016-02-16 MED ORDER — PROMETHAZINE HCL 25 MG/ML IJ SOLN: 6.2500 mg | INTRAMUSCULAR | Status: DC | PRN

## 2016-02-16 MED ORDER — ROCURONIUM BROMIDE 10 MG/ML (PF) SYRINGE
PREFILLED_SYRINGE | INTRAVENOUS | Status: AC
  Filled 2016-02-16: qty 5

## 2016-02-16 MED ORDER — LIDOCAINE 2% (20 MG/ML) 5 ML SYRINGE
INTRAMUSCULAR | Status: AC
  Filled 2016-02-16: qty 5

## 2016-02-16 MED ORDER — ONDANSETRON HCL 4 MG/2ML IJ SOLN: 4.0000 mg | Freq: Four times a day (QID) | INTRAMUSCULAR | Status: DC | PRN

## 2016-02-16 MED ORDER — OXYCODONE-ACETAMINOPHEN 5-325 MG PO TABS: 1.0000 | ORAL_TABLET | ORAL | 0 refills | Status: AC | PRN

## 2016-02-16 MED ORDER — OXYCODONE-ACETAMINOPHEN 5-325 MG PO TABS
1.0000 | ORAL_TABLET | ORAL | Status: DC | PRN
  Administered 2016-02-16: 1 via ORAL
  Filled 2016-02-16: qty 1

## 2016-02-16 MED ORDER — BUPIVACAINE HCL (PF) 0.25 % IJ SOLN
INTRAMUSCULAR | Status: DC | PRN
  Administered 2016-02-16: 10 mL

## 2016-02-16 MED ORDER — SODIUM CHLORIDE 0.9 % IJ SOLN
INTRAMUSCULAR | Status: AC
  Filled 2016-02-16: qty 10

## 2016-02-16 MED ORDER — ENOXAPARIN SODIUM 40 MG/0.4ML ~~LOC~~ SOLN: 40.0000 mg | Freq: Every day | SUBCUTANEOUS | Status: DC

## 2016-02-16 MED ORDER — SUGAMMADEX SODIUM 200 MG/2ML IV SOLN
INTRAVENOUS | Status: AC
  Filled 2016-02-16: qty 2

## 2016-02-16 MED ORDER — ACETAMINOPHEN 500 MG PO TABS
1000.0000 mg | ORAL_TABLET | Freq: Four times a day (QID) | ORAL | Status: DC
  Filled 2016-02-16: qty 2

## 2016-02-16 MED ORDER — SUGAMMADEX SODIUM 200 MG/2ML IV SOLN
INTRAVENOUS | Status: DC | PRN
  Administered 2016-02-16: 200 mg via INTRAVENOUS

## 2016-02-16 MED ORDER — ONDANSETRON HCL 4 MG/2ML IJ SOLN
INTRAMUSCULAR | Status: DC | PRN
  Administered 2016-02-16: 4 mg via INTRAVENOUS

## 2016-02-16 MED ORDER — ONDANSETRON 4 MG PO TBDP: 4.0000 mg | ORAL_TABLET | Freq: Four times a day (QID) | ORAL | Status: DC | PRN

## 2016-02-16 MED ORDER — ACETAMINOPHEN 325 MG PO TABS: ORAL_TABLET | ORAL | 2 refills | Status: AC

## 2016-02-16 MED ORDER — OXYCODONE HCL 5 MG PO TABS: 5.0000 mg | ORAL_TABLET | ORAL | Status: DC | PRN

## 2016-02-16 MED ORDER — ONDANSETRON HCL 4 MG/2ML IJ SOLN
INTRAMUSCULAR | Status: AC
  Filled 2016-02-16: qty 2

## 2016-02-16 MED ORDER — SUFENTANIL CITRATE 50 MCG/ML IV SOLN
INTRAVENOUS | Status: AC
  Filled 2016-02-16: qty 1

## 2016-02-16 MED ORDER — DEXAMETHASONE SODIUM PHOSPHATE 10 MG/ML IJ SOLN
INTRAMUSCULAR | Status: AC
  Filled 2016-02-16: qty 1

## 2016-02-16 MED ORDER — POLYETHYLENE GLYCOL 3350 17 G PO PACK: 17.0000 g | PACK | Freq: Every day | ORAL | Status: DC | PRN

## 2016-02-16 MED ORDER — HYDROMORPHONE HCL 1 MG/ML IJ SOLN: 0.2500 mg | INTRAMUSCULAR | Status: DC | PRN

## 2016-02-16 MED ORDER — IBUPROFEN 200 MG PO TABS: ORAL_TABLET | ORAL | Status: AC

## 2016-02-16 MED ORDER — HYDROMORPHONE HCL 2 MG/ML IJ SOLN: 1.0000 mg | INTRAMUSCULAR | Status: DC | PRN

## 2016-02-16 NOTE — Transfer of Care (Signed)
Immediate Anesthesia Transfer of Care Note  Patient: Jorge Warren  Procedure(s) Performed: Procedure(s): APPENDECTOMY LAPAROSCOPIC (N/A)  Patient Location: PACU  Anesthesia Type:General  Level of Consciousness: awake, alert , oriented and patient cooperative  Airway & Oxygen Therapy: Patient Spontanous Breathing and Patient connected to nasal cannula oxygen  Post-op Assessment: Report given to RN, Post -op Vital signs reviewed and stable and Patient moving all extremities X 4  Post vital signs: Reviewed and stable  Last Vitals:  Vitals:   02/15/16 2059 02/15/16 2211  BP: 148/76 130/79  Pulse: 87 62  Resp: 16 14  Temp:  36.8 C    Last Pain:  Vitals:   02/15/16 2211  TempSrc: Oral  PainSc:          Complications: No apparent anesthesia complications

## 2016-02-16 NOTE — Op Note (Signed)
OPERATIVE REPORT  DATE OF OPERATION:  02/16/2016  PATIENT:  Jorge Warren  25 y.o. male  PRE-OPERATIVE DIAGNOSIS:  Acute appendicitis  POST-OPERATIVE DIAGNOSIS:  Acute appendicitis  INDICATION(S) FOR OPERATION:  Abdominal pain with CT finding of acute appendicitis  FINDINGS:  Acute appendicitis without perforation or gangrene  PROCEDURE:  Procedure(s): APPENDECTOMY LAPAROSCOPIC  SURGEON:  Surgeon(s): Jimmye NormanJames Tyne Banta, MD  ASSISTANT: None  ANESTHESIA:   general  COMPLICATIONS:  None  EBL: <20 ml  BLOOD ADMINISTERED: none  DRAINS: none   SPECIMEN:  Source of Specimen:  Appendix  COUNTS CORRECT:  YES  PROCEDURE DETAILS: The patient was taken to the operating room and placed on the table in the supine position. After an adequate general endotracheal anesthetic was administered, he was prepped and draped in the usual sterile manner exposing the abdomen.  A proper timeout was performed identifying the patient and the procedure to be performed. A supraumbilical midline incision was made using a #15 blade and taken down to the midline fascia. This incision was approximately 1/2 cm long. We dissected down to the midline fascia then incised with a 15 blade. We bluntly dissected down into the peritoneal cavity using a Coker clamps then passed a pursestring suture of 0 Vicryl around the fascial opening. This pursestring suture was used to secure in place a Hassan cannula which was subsequently used to insufflate carbon dioxide gas up to a maximal intra-abdominal pressure of 15 mmHg.  A 5 mm right upper quadrant and a 5 mm left lower quadrant 5 mm cannula was passed under direct vision. The patient was placed in Trendelenburg and the left side was tilted down.  The appendix was somewhat high riding towards the right upper quadrant but was on a mesoappendix was somewhat of the pedicle. We dissected out the appendix down to the base at the cecum coming across the mesoappendix using the  harmonic scalpel.  Once we skeletonized the vasculature of the appendix down to the cecal base, we came across the appendix using a blue cartridge Endo GIA detaching it completely allowing us to retrieve it from the supraumbilical site using an Endo Catch bag.  Once the appendix was removed, we inspected the base for closure and blood control those minimal bleeding. We irrigated with approximately 400 mL of saline solution then placed the patient in the neutral position.  We removed the supraumbilical cannula under direct vision and tied down the pursestring suture to close out the fascia. Once this was done we aspirated all fluid and gas from above the liver. We then placed the patient in the neutral position and closed.  The supraumbilical skin site was closed using a running subcuticular stitch of 4-0 Monocryl. 0.25% Marcaine was injected at all sites. Dermabond Steri-Strips and Tegaderms are used to complete the dressings. All needle counts, sponge counts, and instrument counts were correct.  PATIENT DISPOSITION:  PACU - hemodynamically stable.   Kamorie Aldous 12/28/201712:32 AM

## 2016-02-16 NOTE — Progress Notes (Signed)
1 Day Post-Op  Subjective: He looks fine, sites OK.  Eating breakfast.  Will let him try PO pain meds and if OK home later today.  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    Objective: Vital signs in last 24 hours: Temp:  [97.7 F (36.5 C)-98.6 F (37 C)] 98.3 F (36.8 C) (12/28 0505) Pulse Rate:  [62-101] 90 (12/28 0505) Resp:  [13-18] 16 (12/28 0505) BP: (120-148)/(64-88) 126/64 (12/28 0505) SpO2:  [92 %-100 %] 99 % (12/28 0505) Weight:  [93 kg (205 lb)-93 kg (205 lb 0.4 oz)] 93 kg (205 lb 0.4 oz) (12/28 0120) Last BM Date: 02/15/16 1000 IV 600 urine Afebrile, VSS No labs this AM Intake/Output from previous day: 12/27 0701 - 12/28 0700 In: 1050 [I.V.:1000; IV Piggyback:50] Out: 605 [Urine:600; Blood:5] Intake/Output this shift: No intake/output data recorded.  General appearance: alert, cooperative and no distress Resp: clear to auscultation bilaterally GI: soft, sore, sites OK  Lab Results:   Recent Labs  02/15/16 1918  WBC 12.9*  HGB 14.4  HCT 43.3  PLT 304    BMET  Recent Labs  02/15/16 1918  NA 140  K 3.9  CL 103  CO2 27  GLUCOSE 128*  BUN 9  CREATININE 1.11  CALCIUM 9.9   PT/INR No results for input(s): LABPROT, INR in the last 72 hours.   Recent Labs Lab 02/15/16 1918  AST 80*  ALT 88*  ALKPHOS 91  BILITOT 0.7  PROT 7.8  ALBUMIN 4.3     Lipase     Component Value Date/Time   LIPASE 27 02/15/2016 1918     Studies/Results: Ct Abdomen Pelvis W Contrast  Result Date: 02/15/2016 CLINICAL DATA:  Right lower quadrant abdominal pain, onset yesterday. Nausea. EXAM: CT ABDOMEN AND PELVIS WITH CONTRAST TECHNIQUE: Multidetector CT imaging of the abdomen and pelvis was performed using the standard protocol following bolus administration of intravenous contrast. CONTRAST:  100mL ISOVUE-300 IOPAMIDOL (ISOVUE-300) INJECTION 61% COMPARISON:  None. FINDINGS: Lower chest: The lung bases are clear. Hepatobiliary: No  focal liver abnormality is seen. No gallstones, gallbladder wall thickening, or biliary dilatation. Pancreas: Unremarkable. No pancreatic ductal dilatation or surrounding inflammatory changes. Spleen: Normal in size without focal abnormality. Adrenals/Urinary Tract: Adrenal glands are unremarkable. Kidneys are normal, without renal calculi, focal lesion, or hydronephrosis. Bladder is unremarkable. Stomach/Bowel: The appendix is dilated measuring 10 mm, with wall thickening and intraluminal fluid. There is periappendiceal soft tissue stranding and small amount of free fluid. No perforation or abscess. Stomach is decompressed. Fluid within normal caliber small bowel loops in the pelvis, likely reactive. No colonic inflammation. No bowel obstruction. Vascular/Lymphatic: Small right lower quadrant and ileocolic nodes are likely reactive. No retroperitoneal or pelvic adenopathy. No acute vascular finding. Reproductive: Prostate is unremarkable. Other: Trace free fluid in the pelvis is likely reactive. New free air or intra- abdominal abscess. Musculoskeletal: There are no acute or suspicious osseous abnormalities. IMPRESSION: Uncomplicated acute appendicitis. Electronically Signed   By: Rubye OaksMelanie  Ehinger M.D.   On: 02/15/2016 21:19   Prior to Admission medications   Medication Sig Start Date End Date Taking? Authorizing Provider  ibuprofen (ADVIL,MOTRIN) 800 MG tablet Take 1 tablet (800 mg total) by mouth 3 (three) times daily. Patient not taking: Reported on 02/15/2016 05/10/13   Elpidio AnisShari Upstill, PA-C    Medications: . acetaminophen  1,000 mg Oral Q6H  . enoxaparin (LOVENOX) injection  40 mg Subcutaneous Daily  . ondansetron (ZOFRAN) IV  4 mg Intravenous Once     Assessment/Plan Acute appendicitis S/p laparoscopic appendectomy, 02/16/16, Dr. Jimmye NormanJames Wyatt FEN:  Regular diet ID:  Pre op only DVT:  Lovenox  Plan: PO pain meds, mobilize and home later today       LOS: 0 days     Diasia Henken 02/16/2016 [id number]

## 2016-02-16 NOTE — Anesthesia Postprocedure Evaluation (Signed)
Anesthesia Post Note  Patient: Jorge DrapeDaniel R Warren  Procedure(s) Performed: Procedure(s) (LRB): APPENDECTOMY LAPAROSCOPIC (N/A)  Patient location during evaluation: PACU Anesthesia Type: General Level of consciousness: awake and alert Pain management: pain level controlled Vital Signs Assessment: post-procedure vital signs reviewed and stable Respiratory status: spontaneous breathing, nonlabored ventilation, respiratory function stable and patient connected to nasal cannula oxygen Cardiovascular status: blood pressure returned to baseline and stable Postop Assessment: no signs of nausea or vomiting Anesthetic complications: no       Last Vitals:  Vitals:   02/16/16 0111 02/16/16 0120  BP: 120/77 125/72  Pulse:  64  Resp:  16  Temp:  36.9 C    Last Pain:  Vitals:   02/16/16 0120  TempSrc: Oral  PainSc:                  Kennieth RadFitzgerald, Yuki Purves E

## 2016-02-16 NOTE — Discharge Instructions (Signed)
[facility] ______[facility] °LAPAROSCOPIC SURGERY: POST OP INSTRUCTIONS °Always review your discharge instruction sheet given to you by the facility where your surgery was performed. °IF YOU HAVE DISABILITY OR FAMILY LEAVE FORMS, YOU MUST BRING THEM TO THE OFFICE FOR PROCESSING.   °DO NOT GIVE THEM TO YOUR DOCTOR. ° °1. A prescription for pain medication may be given to you upon discharge.  Take your pain medication as prescribed, if needed.  If narcotic pain medicine is not needed, then you may take acetaminophen (Tylenol) or ibuprofen (Advil) as needed. °2. Take your usually prescribed medications unless otherwise directed. °3. If you need a refill on your pain medication, please contact your pharmacy.  They will contact our office to request authorization. Prescriptions will not be filled after 5pm or on week-ends. °4. You should follow a light diet the first few days after arrival home, such as soup and crackers, etc.  Be sure to include lots of fluids daily. °5. Most patients will experience some swelling and bruising in the area of the incisions.  Ice packs will help.  Swelling and bruising can take several days to resolve.  °6. It is common to experience some constipation if taking pain medication after surgery.  Increasing fluid intake and taking a stool softener (such as Colace) will usually help or prevent this problem from occurring.  A mild laxative (Milk of Magnesia or Miralax) should be taken according to package instructions if there are no bowel movements after 48 hours. °7. Unless discharge instructions indicate otherwise, you may remove your bandages 24-48 hours after surgery, and you may shower at that time.  You may have steri-strips (small skin tapes) in place directly over the incision.  These strips should be left on the skin for 7-10 days.  If your surgeon used skin glue on the incision, you may shower in 24 hours.  The glue will flake off over the next 2-3 weeks.  Any sutures or  staples will be removed at the office during your follow-up visit. °8. ACTIVITIES:  You may resume regular (light) daily activities beginning the next day--such as daily self-care, walking, climbing stairs--gradually increasing activities as tolerated.  You may have sexual intercourse when it is comfortable.  Refrain from any heavy lifting or straining until approved by your doctor. °a. You may drive when you are no longer taking prescription pain medication, you can comfortably wear a seatbelt, and you can safely maneuver your car and apply brakes. °b. RETURN TO WORK:  __________________________________________________________ °9. You should see your doctor in the office for a follow-up appointment approximately 2-3 weeks after your surgery.  Make sure that you call for this appointment within a day or two after you arrive home to insure a convenient appointment time. °10. OTHER INSTRUCTIONS: __________________________________________________________________________________________________________________________ __________________________________________________________________________________________________________________________ °WHEN TO CALL YOUR DOCTOR: °1. Fever over 101.0 °2. Inability to urinate °3. Continued bleeding from incision. °4. Increased pain, redness, or drainage from the incision. °5. Increasing abdominal pain ° °The clinic staff is available to answer your questions during regular business hours.  Please don’t hesitate to call and ask to speak to one of the nurses for clinical concerns.  If you have a medical emergency, go to the nearest emergency room or call 911.  A surgeon from [facility] is always on call at the hospital. °[address], [address], [place], [place]  [address] ? [address] [address], ,    [address] °[phone] ? [phone] ? FAX [phone] °Web site:   [email or web address] ° ° °Laparoscopic Appendectomy, Adult, Care After °Refer to  this sheet in the next few weeks. These instructions provide you with information about caring for yourself after your procedure. Your health care provider may also give you more specific instructions. Your treatment has been planned according to current medical practices, but problems sometimes occur. Call your health care provider if you have any problems or questions after your procedure. °What can I expect after the procedure? °After the procedure, it is common to have: °· A decrease in your energy level. °· Mild pain in the area where the surgical cuts (incisions) were made. °· Constipation. This can be caused by pain medicine and a decrease in your activity. °Follow these instructions at home: °Medicines  °· Take over-the-counter and prescription medicines only as told by your health care provider. °· Do not drive for 24 hours if you received a sedative. °· Do not drive or operate heavy machinery while taking prescription pain medicine. °· If you were prescribed an antibiotic medicine, take it as told by your health care provider. Do not stop taking the antibiotic even if you start to feel better. °Activity  °· For 3 weeks or as long as told by your health care provider: °¨ Do not lift anything that is heavier than 10 pounds (4.5 kg). °¨ Do not play contact sports. °· Gradually return to your normal activities. Ask your health care provider what activities are safe for you. °Bathing  °· Keep your incisions clean and dry. Clean them as often as told by your health care provider: °¨ Gently wash the incisions with soap and water. °¨ Rinse the incisions with water to remove all soap. °¨ Pat the incisions dry with a clean towel. Do not rub the incisions. °· You may take showers after 48 hours. °· Do not take baths, swim, or use hot tubs for 2 weeks or as told by your health care provider. °Incision care  °· Follow instructions from your healthcare provider about how to take care of your incisions. Make sure  you: °¨ Wash your hands with soap and water before you change your bandage (dressing). If soap and water are not available, use hand sanitizer. °¨ Change your dressing as told by your health care provider. °¨ Leave stitches (sutures), skin glue, or adhesive strips in place. These skin closures may need to stay in place for 2 weeks or longer. If adhesive strip edges start to loosen and curl up, you may trim the loose edges. Do not remove adhesive strips completely unless your health care provider tells you to do that. °· Check your incision areas every day for signs of infection. Check for: °¨ More redness, swelling, or pain. °¨ More fluid or blood. °¨ Warmth. °¨ Pus or a bad smell. °Other Instructions  °· If you were sent home with a drain, follow instructions from your health care provider about how to care for the drain and how to empty it. °· Take deep breaths. This helps to prevent your lungs from becoming inflamed. °· To relieve and prevent constipation: °¨ Drink plenty of fluids. °¨ Eat plenty of fruits and vegetables. °· Keep all follow-up visits as told by your health care provider. This is important. °Contact a health care provider if: °· You have more redness, swelling, or pain around an incision. °· You have more fluid or blood coming from an incision. °· Your incision feels warm to the touch. °· You have pus or a bad smell coming   from an incision or dressing. °· Your incision edges break open after your sutures have been removed. °· You have increasing pain in your shoulders. °· You feel dizzy or you faint. °· You develop shortness of breath. °· You keep feeling nauseous or vomiting. °· You have diarrhea or you cannot control your bowel functions. °· You lose your appetite. °· You develop swelling or pain in your legs. °Get help right away if: °· You have a fever. °· You develop a rash. °· You have difficulty breathing. °· You have sharp pains in your chest. °This information is not intended to replace  advice given to you by your health care provider. Make sure you discuss any questions you have with your health care provider. °Document Released: 02/05/2005 Document Revised: 07/08/2015 Document Reviewed: 07/26/2014 °Elsevier Interactive Patient Education © 2017 [employer] ° °

## 2016-02-16 NOTE — Progress Notes (Signed)
Patient received from PACU s/p laparoscopic appendectomy. Alert and oriented x4. Denies pain at this time.oriented to room and call bell.

## 2017-09-26 IMAGING — CT CT ABD-PELV W/ CM
2 of 4 series · 16 of 46 positions shown, 18 images · IV contrast (iopamidol)
Comparison: None.

CLINICAL DATA: Right lower quadrant abdominal pain, onset
yesterday. Nausea.

EXAM:
CT ABDOMEN AND PELVIS WITH CONTRAST
TECHNIQUE: Multidetector CT imaging of the abdomen and pelvis was performed
using the standard protocol following bolus administration of
intravenous contrast.
CONTRAST:  100mL TKHWAK-PSS IOPAMIDOL (TKHWAK-PSS) INJECTION 61%

[Series 2: abd/ pelvis 5.0 i30f 1 · axial · 0.64mm/px · z∈[+933,+1383]mm · 13 of 98 slices shown, 15 images]
[im 4/98  soft-tissue]
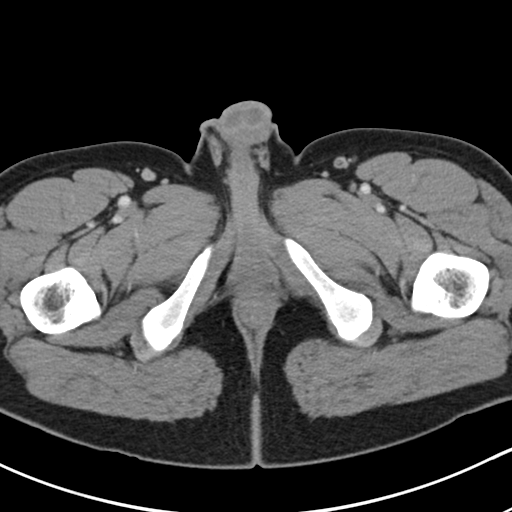
[im 4/98  bone]
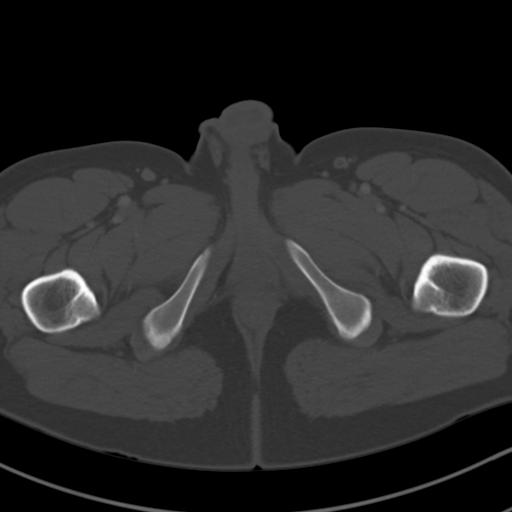
[im 12/98  soft-tissue]
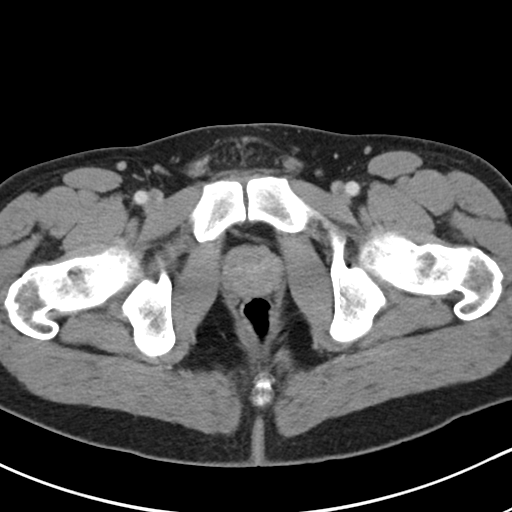
[im 20/98  soft-tissue]
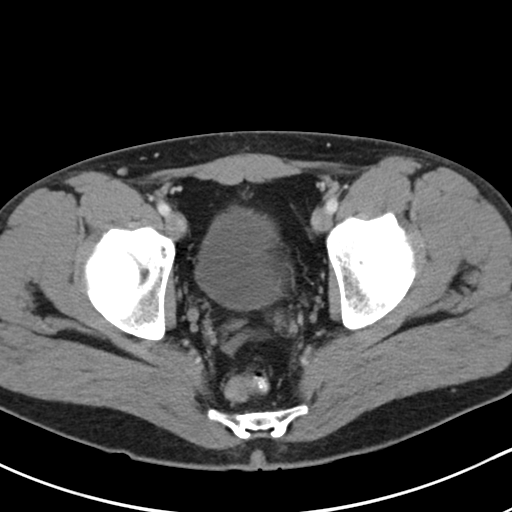
[im 28/98  soft-tissue]
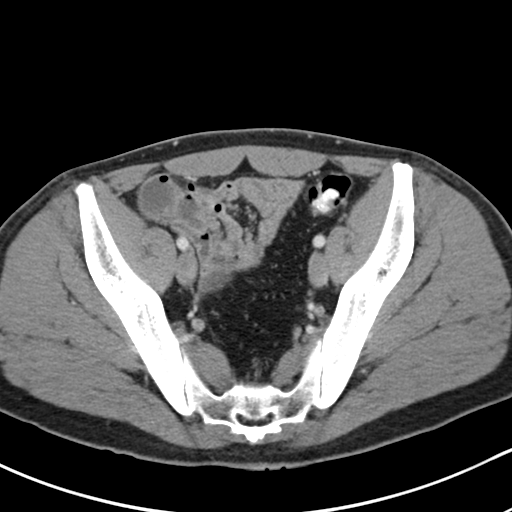
[im 35/98  soft-tissue]
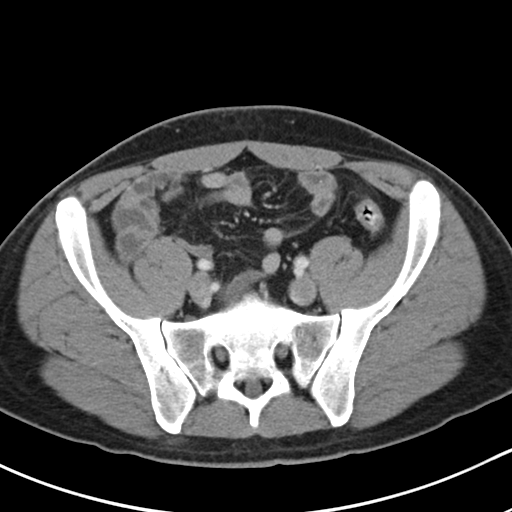
[im 43/98  soft-tissue]
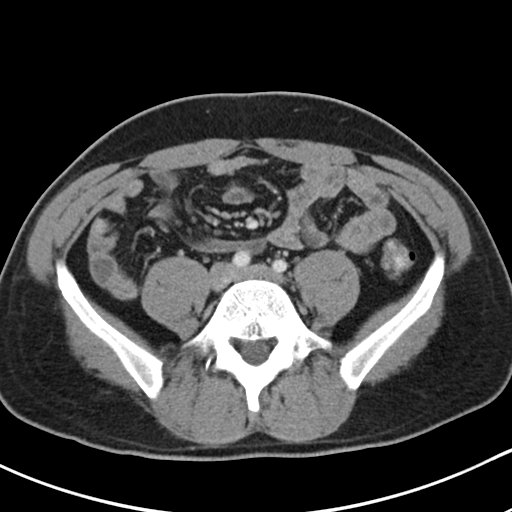
[im 51/98  soft-tissue]
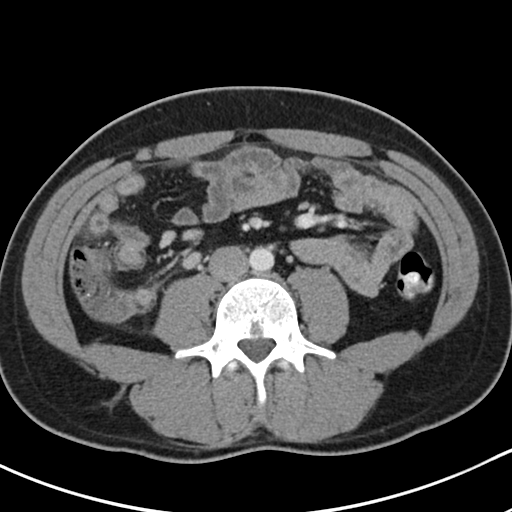
[im 55/98  soft-tissue]
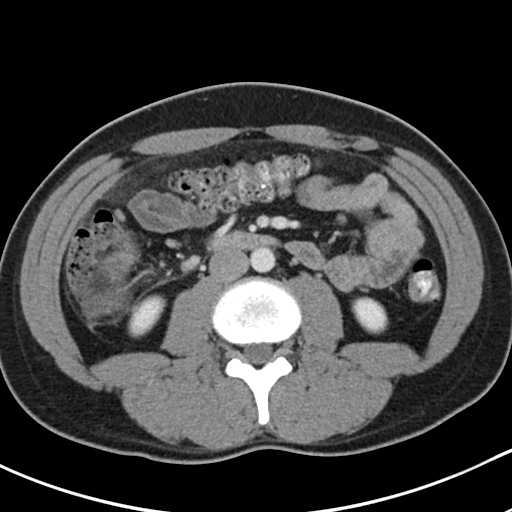
[im 63/98  soft-tissue]
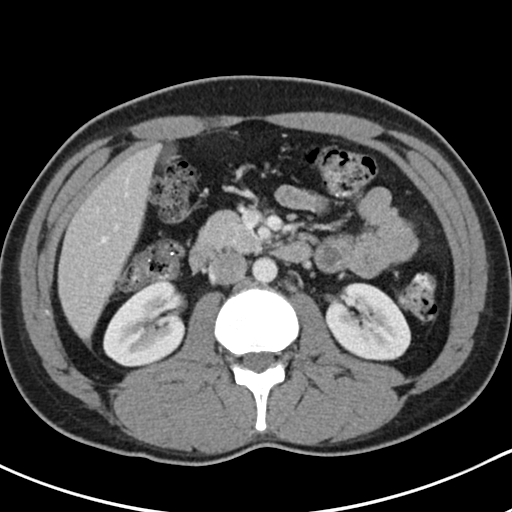
[im 63/98  bone]
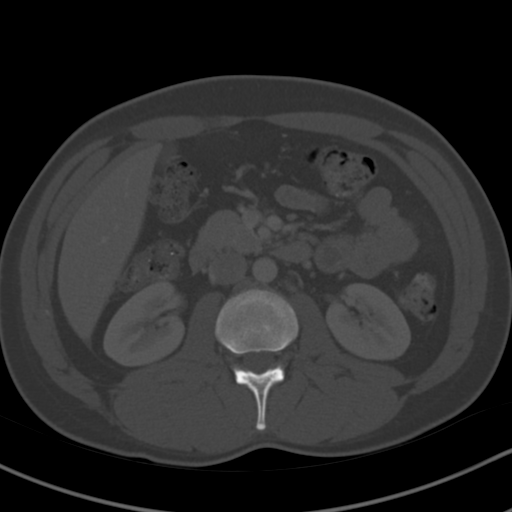
[im 70/98  soft-tissue]
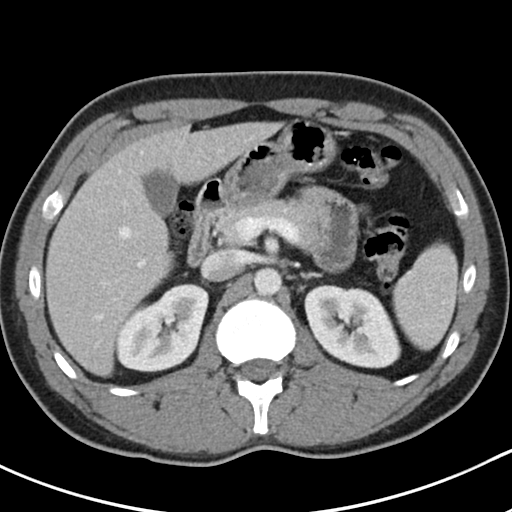
[im 78/98  soft-tissue]
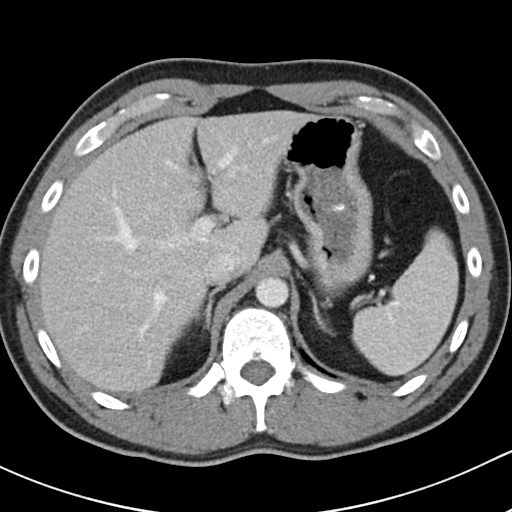
[im 86/98  soft-tissue]
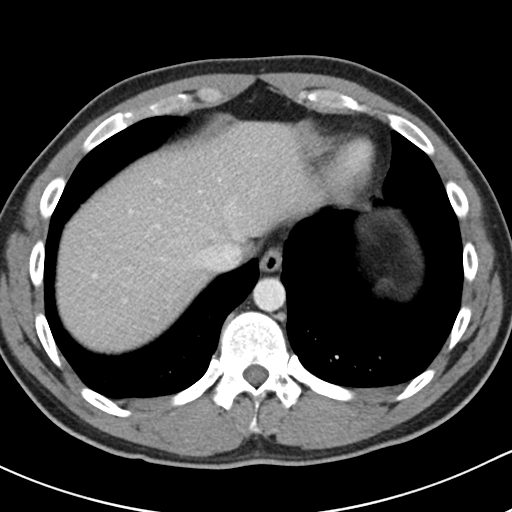
[im 94/98  soft-tissue]
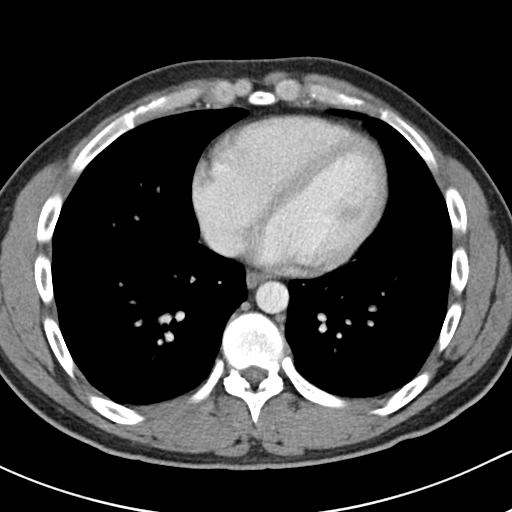

[Series 5: coronal soft tissue · coronal · 0.76mm/px · 3 of 76 slices shown]
[im 26/76  soft-tissue]
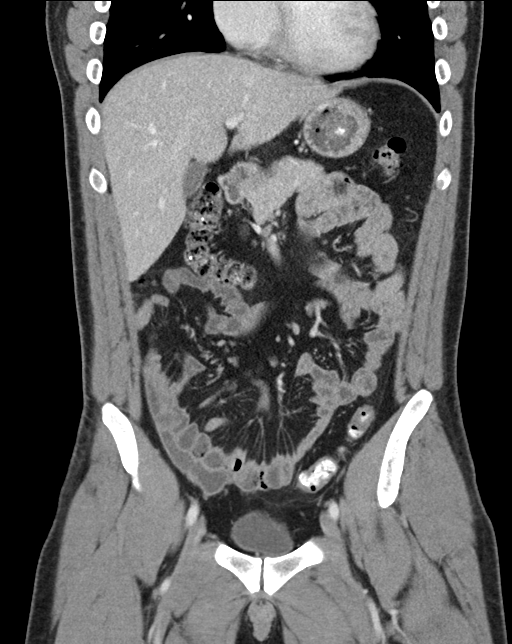
[im 34/76  soft-tissue]
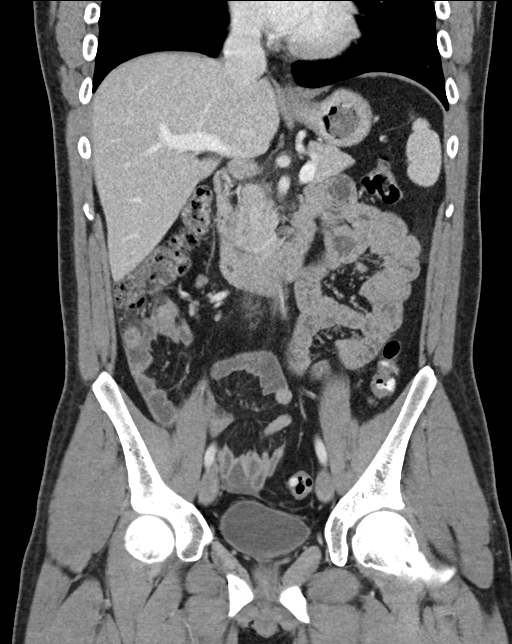
[im 42/76  soft-tissue]
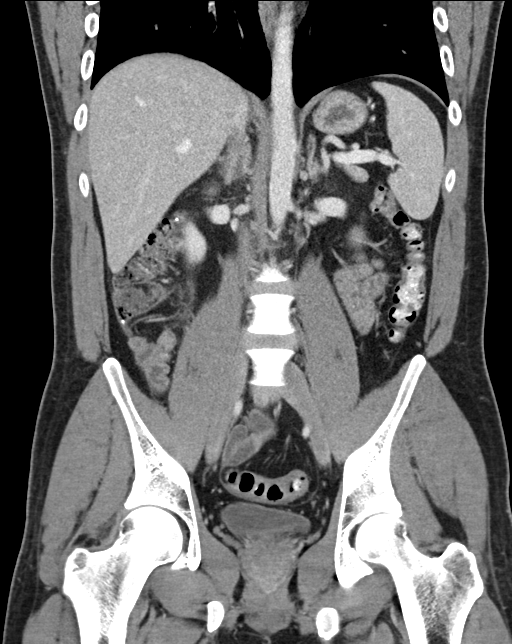

[16 of 46 positions shown; findings below may reference images not displayed]

FINDINGS: Lower chest: The lung bases are clear.

Hepatobiliary: No focal liver abnormality is seen. No gallstones,
gallbladder wall thickening, or biliary dilatation.

Pancreas: Unremarkable. No pancreatic ductal dilatation or
surrounding inflammatory changes.

Spleen: Normal in size without focal abnormality.

Adrenals/Urinary Tract: Adrenal glands are unremarkable. Kidneys are
normal, without renal calculi, focal lesion, or hydronephrosis.
Bladder is unremarkable.

Stomach/Bowel: The appendix is dilated measuring 10 mm, with wall
thickening and intraluminal fluid. There is periappendiceal soft
tissue stranding and small amount of free fluid. No perforation or
abscess. Stomach is decompressed. Fluid within normal caliber small
bowel loops in the pelvis, likely reactive. No colonic inflammation.
No bowel obstruction.

Vascular/Lymphatic: Small right lower quadrant and ileocolic nodes
are likely reactive. No retroperitoneal or pelvic adenopathy. No
acute vascular finding.

Reproductive: Prostate is unremarkable.

Other: Trace free fluid in the pelvis is likely reactive. New free
air or intra- abdominal abscess.

Musculoskeletal: There are no acute or suspicious osseous
abnormalities.
IMPRESSION: Uncomplicated acute appendicitis.

## 2020-07-29 ENCOUNTER — Other Ambulatory Visit: Payer: Self-pay | Admitting: Family Medicine

## 2020-07-29 DIAGNOSIS — R519 Headache, unspecified: Secondary | ICD-10-CM

## 2020-09-09 ENCOUNTER — Other Ambulatory Visit: Payer: Self-pay
